# Patient Record
Sex: Female | Born: 1985 | Race: White | Hispanic: No | Marital: Married | Smoking: Never smoker
Health system: Southern US, Community
[De-identification: ages and names within clinical notes are randomized; demographics above are authoritative.]

## PROBLEM LIST (undated history)

## (undated) DIAGNOSIS — Z789 Other specified health status: Secondary | ICD-10-CM

## (undated) HISTORY — PX: NO PAST SURGERIES: SHX2092

---

## 2015-10-17 LAB — OB RESULTS CONSOLE RPR: RPR: NONREACTIVE

## 2015-10-17 LAB — OB RESULTS CONSOLE HEPATITIS B SURFACE ANTIGEN: HEP B S AG: NEGATIVE

## 2015-10-17 LAB — OB RESULTS CONSOLE HIV ANTIBODY (ROUTINE TESTING): HIV: NONREACTIVE

## 2015-10-17 LAB — OB RESULTS CONSOLE VARICELLA ZOSTER ANTIBODY, IGG: Varicella: IMMUNE

## 2015-10-17 LAB — OB RESULTS CONSOLE RUBELLA ANTIBODY, IGM: Rubella: IMMUNE

## 2015-10-18 ENCOUNTER — Other Ambulatory Visit: Payer: Self-pay | Admitting: Obstetrics and Gynecology

## 2015-10-18 DIAGNOSIS — Z369 Encounter for antenatal screening, unspecified: Secondary | ICD-10-CM

## 2015-11-05 ENCOUNTER — Ambulatory Visit (HOSPITAL_BASED_OUTPATIENT_CLINIC_OR_DEPARTMENT_OTHER)
Admission: RE | Admit: 2015-11-05 | Discharge: 2015-11-05 | Disposition: A | Discharge status: Home / Self Care | Source: Ambulatory Visit | Attending: Obstetrics and Gynecology | Admitting: Obstetrics and Gynecology

## 2015-11-05 ENCOUNTER — Ambulatory Visit
Admission: RE | Admit: 2015-11-05 | Discharge: 2015-11-05 | Disposition: A | Discharge status: Home / Self Care | Source: Ambulatory Visit | Attending: Obstetrics and Gynecology | Admitting: Obstetrics and Gynecology

## 2015-11-05 DIAGNOSIS — Z36 Encounter for antenatal screening of mother: Secondary | ICD-10-CM | POA: Diagnosis not present

## 2015-11-05 DIAGNOSIS — Z369 Encounter for antenatal screening, unspecified: Secondary | ICD-10-CM | POA: Insufficient documentation

## 2015-11-05 HISTORY — DX: Other specified health status: Z78.9

## 2015-11-05 NOTE — Progress Notes (Addendum)
[facility]  [facility] Length of Consultation: 40 minutes   Ms. Suzanne Bradley  [facility] for genetic counseling to review prenatal screening and testing options.  This note summarizes the information we discussed.    We offered the following routine screening tests for this pregnancy:  First trimester screening, which includes nuchal translucency ultrasound screen and first trimester maternal serum marker screening.  The nuchal translucency has approximately an 80% detection rate for Down syndrome and can be positive for other chromosome abnormalities as well as congenital heart defects.  When combined with a maternal serum marker screening, the detection rate is up to 90% for Down syndrome and up to 97% for trisomy 18.     Maternal serum marker screening, a blood test that measures pregnancy proteins, can provide risk assessments for Down syndrome, trisomy 18, and open neural tube defects (spina bifida, anencephaly). Because it does not directly examine the fetus, it cannot positively diagnose or rule out these problems.  Targeted ultrasound uses high frequency sound waves to create an image of the developing fetus.  An ultrasound is often recommended as a routine means of evaluating the pregnancy.  It is also used to screen for fetal anatomy problems (for example, a heart defect) that might be suggestive of a chromosomal or other abnormality.   Should these screening tests indicate an increased concern, then the following additional testing options would be offered:  The chorionic villus sampling procedure is available for first trimester chromosome analysis.  This involves the withdrawal of a small amount of chorionic villi (tissue from the developing placenta).  Risk of pregnancy loss is estimated to be approximately 1 in 200 to 1 in 100 (0.5 to 1%).  There is approximately a 1% (1 in 100) chance that the CVS chromosome results will be unclear.   Chorionic villi cannot be tested for neural tube defects.     Amniocentesis involves the removal of a small amount of amniotic fluid from the sac surrounding the fetus with the use of a thin needle inserted through the maternal abdomen and uterus.  Ultrasound guidance is used throughout the procedure.  Fetal cells from amniotic fluid are directly evaluated and > 99.5% of chromosome problems and > 98% of open neural tube defects can be detected. This procedure is generally performed after the 15th week of pregnancy.  The main risks to this procedure include complications leading to miscarriage in less than 1 in 200 cases (0.5%).  As another option for information if the pregnancy is suspected to be an an increased chance for certain chromosome conditions, we also reviewed the availability of cell free fetal DNA testing from maternal blood to determine whether or not the baby may have either Down syndrome, trisomy 5413, or trisomy 2118.  This test utilizes a maternal blood sample and DNA sequencing technology to isolate circulating cell free fetal DNA from maternal plasma.  The fetal DNA can then be analyzed for DNA sequences that are derived from the three most common chromosomes involved in aneuploidy, chromosomes 13, 18, and 21.  If the overall amount of DNA is greater than the expected level for any of these chromosomes, aneuploidy is suspected.  While we do not consider it a replacement for invasive testing and karyotype analysis, a negative result from this testing would be reassuring, though not a guarantee of a normal chromosome complement for the baby.  An abnormal result is certainly suggestive of an abnormal chromosome complement, though  we would still recommend CVS or amniocentesis to confirm any findings from this testing.  Cystic Fibrosis and Spinal Muscular Atrophy (SMA) screening were also discussed with the patient. Both conditions are recessive, which means that both parents must be carriers in  order to have a child with the disease.  Cystic fibrosis (CF) is one of the most common genetic conditions in persons of Caucasian ancestry.  This condition occurs in approximately 1 in 2,500 Caucasian persons and results in thickened secretions in the lungs, digestive, and reproductive systems.  For a baby to be at risk for having CF, both of the parents must be carriers for this condition.  Approximately 1 in 68 Caucasian persons is a carrier for CF.  Current carrier testing looks for the most common mutations in the gene for CF and can detect approximately 90% of carriers in the Caucasian population.  This means that the carrier screening can greatly reduce, but cannot eliminate, the chance for an individual to have a child with CF.  If an individual is found to be a carrier for CF, then carrier testing would be available for the partner. As part of [place]'s newborn screening profile, all babies born in the state of [place] will have a two-tier screening process.  Specimens are first tested to determine the concentration of immunoreactive trypsinogen (IRT).  The top 5% of specimens with the highest IRT values then undergo DNA testing using a panel of over 40 common CF mutations. SMA is a neurodegenerative disorder that leads to atrophy of skeletal muscle and overall weakness.  This condition is also more prevalent in the Caucasian population, with 1 in 40-1 in 60 persons being a carrier and 1 in 6,000-1 in 10,000 children being affected.  There are multiple forms of the disease, with some causing death in infancy to other forms with survival into adulthood.  The genetics of SMA is complex, but carrier screening can detect up to 95% of carriers in the Caucasian population.  Similar to CF, a negative result can greatly reduce, but cannot eliminate, the chance to have a child with SMA.  We obtained a detailed family history and pregnancy history.  This is the second pregnancy for this couple.  They  have a healthy 63 year old daughter.  The father of the baby, Joselyn Glassman, is on Humira for treatment of psoriasis.  Only two studies were found on the reproductive issues of this medication in men.  One showed no adverse effects on sperm parameters while the other found semen quality in 10 patients to be poorer than in controls.  No specific birth defects were mentioned in these studies.  Joselyn Glassman also mentioned that his father and one paternal uncle passed away in their 24s from a massive heart attack.  He stated that his mother took him to be testing for the same condition and he did not have it.  He was not aware of the name of the condition or what type of testing was done.  Some genetic conditions may predispose to early heart attack and/or sudden death and we offered to review medical records on this testing, which he declined.  We encouraged him to stay in contact with his primary care doctor regarding this history.  The remainder of the family history was reported to be unremarkable for birth defects, mental retardation, recurrent pregnancy loss or known chromosome abnormalities.  Ms. Biggers reported no complications or exposures in this pregnancy that would be expected to increase the risk  for birth defects.   After consideration of the options, Ms. Pederson elected to proceed with first trimester screening and to decline CF and SMA carrier testing.  An ultrasound was performed at the time of the visit.  The gestational age was consistent with  13 weeks.  Fetal anatomy could not be assessed due to early gestational age.  Please refer to the ultrasound report for details of that study.  Ms. Marion was encouraged to call with questions or concerns.  We can be contacted at [phone].    Cherly Anderson, MS, CGC  I saw the pt and agree with plan in genetic counselors note Jimmey Ralph, MD

## 2015-11-08 ENCOUNTER — Telehealth: Payer: Self-pay | Admitting: Obstetrics and Gynecology

## 2015-11-08 NOTE — Telephone Encounter (Signed)
Ms. Suzanne Bradley  elected to undergo First Trimester screening on 11/05/2015.  To review, first trimester screening, includes nuchal translucency ultrasound screen and/or first trimester maternal serum marker screening.  The nuchal translucency has approximately an 80% detection rate for Down syndrome and can be positive for other chromosome abnormalities as well as heart defects.  When combined with a maternal serum marker screening, the detection rate is up to 90% for Down syndrome and up to 97% for trisomy 13 and 18.     The results of the First Trimester Nuchal Translucency and Biochemical Screening were within normal range.  The risk for Down syndrome is now estimated to be 1 in 9,424.  The risk for Trisomy 13/18 is less than 1 in 10,000.  Should more definitive information be desired, we would offer amniocentesis.  Because we do not yet know the effectiveness of combined first and second trimester screening, we do not recommend a maternal serum screen to assess the chance for chromosome conditions.  However, if screening for neural tube defects is desired, maternal serum screening for AFP only can be performed between 15 and [redacted] weeks gestation.     Suzanne Andersoneborah F. Duvan Mousel, MS, CGC

## 2016-02-25 NOTE — L&D Delivery Note (Signed)
VAGINAL DELIVERY NOTE:  Date of Delivery: 05/09/2016 Primary OB: Valetta Mulroy/Ward Gestational Age/EDD: 4656w3d 05/06/2016, by Last Menstrual Period Antepartum complications: post-dates Attending Physician: Yetta BarreJones, CNM Delivery Type: spontaneous vaginal delivery  Anesthesia: none Laceration: none Episiotomy: none Placenta: spontaneous Intrapartum complications: None Estimated Blood Loss: 100ml GBS: Neg Procedure Details: CTSP due to C/C/+2 station. Arrived and pt placed in lithotomy pos. Pushing x 3 and NSVD of viable female in straight OA pos with no CAN. Vtx, Ant and post shoulder del at 1249 and to mom's abd, short cord noted, delayed cord clamping till pulsation stopped and then CCx2 and cut per dad. 3 VC noted. Baby crying. SDOP intact at 1255. EBL 100 mls. VSS. No lacs or epis. Bonding with infant. Skin to skin on mom's chest. Cord blood sent. Hemostasis achieved.   Baby: Liveborn Female , Apgars: 8,9 , weight  #, oz, baby named "

## 2016-04-11 LAB — OB RESULTS CONSOLE GC/CHLAMYDIA
Chlamydia: NEGATIVE
Gonorrhea: NEGATIVE

## 2016-04-11 LAB — OB RESULTS CONSOLE GBS: STREP GROUP B AG: NEGATIVE

## 2016-04-25 ENCOUNTER — Inpatient Hospital Stay
Admission: EM | Admit: 2016-04-25 | Discharge: 2016-04-25 | Disposition: A | Discharge status: Home / Self Care | Attending: Obstetrics and Gynecology | Admitting: Obstetrics and Gynecology

## 2016-04-25 DIAGNOSIS — Z3A38 38 weeks gestation of pregnancy: Secondary | ICD-10-CM | POA: Diagnosis not present

## 2016-04-25 DIAGNOSIS — O471 False labor at or after 37 completed weeks of gestation: Secondary | ICD-10-CM | POA: Diagnosis not present

## 2016-04-25 NOTE — Progress Notes (Signed)
TRIAGE VISIT with NST  Indication: Contractions and leaking of fluid   S: Suzanne Bradley is a 30 y.o. G2P1001. She is at [redacted]w[redacted]d gestation, presenting with signs of labor.  She called the office today with c/o leaking clear fluid this morning.  On exam in the office she was 3/80%/-3/vtx/posterior.  She states she has not worn a pad today, but that she kept feeling wet with a clear white discharge.  She denies abnormal vaginal discharge, odor, or dysuria.  She c/o irregular contractions.  She endorses good fetal movement.    O:  BP 117/75   Pulse 73   LMP 07/31/2015  No results found for this or any previous visit (from the past 48 hour(s)).   Gen: NAD, AAOx3                                           Abd: FNTTP                                                     Ext: Non-tender, Nonedmeatous                      NST/FHT: Baseline: 135 bpm/ moderate variability/ +accels/ no decels  TOCO: irregular  SVE:Dilation: 3 Effacement (%): 60 Cervical Position: Posterior Exam by:: M. Sigmon CNM  Some bloody show present on glove, but the patient had been checked 3 times and her cervix is very posterior.    NST: Reactive. See FHT above for particulars.  A/P:  30 y.o. G2P1001 [redacted]w[redacted]d with rule out rupture of membranes.    Labor: not present.   R/o ROM: Nitrazine negative x 2, no pooling noted.   Fetal Wellbeing: NST reactive Reassuring Cat 1 tracing.  FKC's daily   Labor precautions and warning s/s reviewed   D/c home stable, precautions reviewed, follow-up as scheduled.   F/U in office on 04/29/16   Meredith Sigmon, CNM  

## 2016-04-25 NOTE — Final Progress Note (Signed)
TRIAGE VISIT with NST  Indication: Contractions and leaking of fluid   S: Suzanne Bradley is a 31 y.o. G2P1001. She is at 3253w3d gestation, presenting with signs of labor.  She called the office today with c/o leaking clear fluid this morning.  On exam in the office she was 3/80%/-3/vtx/posterior.  She states she has not worn a pad today, but that she kept feeling wet with a clear white discharge.  She denies abnormal vaginal discharge, odor, or dysuria.  She c/o irregular contractions.  She endorses good fetal movement.    O:  BP 117/75   Pulse 73   LMP 07/31/2015  No results found for this or any previous visit (from the past 48 hour(s)).   Gen: NAD, AAOx3                                           Abd: FNTTP                                                     Ext: Non-tender, Nonedmeatous                      NST/FHT: Baseline: 135 bpm/ moderate variability/ +accels/ no decels  TOCO: irregular  ZOX:[id number]:Dilation: 3 Effacement (%): 60 Cervical Position: Posterior Exam by:: M. Cougar Imel CNM  Some bloody show present on glove, but the patient had been checked 3 times and her cervix is very posterior.    NST: Reactive. See FHT above for particulars.  A/P:  31 y.o. G2P1001 6353w3d with rule out rupture of membranes.    Labor: not present.   R/o ROM: Nitrazine negative x 2, no pooling noted.   Fetal Wellbeing: NST reactive Reassuring Cat 1 tracing.  FKC's daily   Labor precautions and warning s/s reviewed   D/c home stable, precautions reviewed, follow-up as scheduled.   F/U in office on 04/29/16   Carlean JewsMeredith Valera Vallas, CNM

## 2016-04-25 NOTE — OB Triage Note (Signed)
Patient presents to L&D with c/o leaking of clear fluid since 1100 this am.  Patient denies any contractions and states baby is moving well.  Patients any vaginal bleeding other than spotting after her cervix was checked on Tuesday.

## 2016-04-25 NOTE — Discharge Instructions (Signed)
Perform your kick counts twice a day Return for vaginal bleeding, leaking of fluid.

## 2016-05-05 ENCOUNTER — Other Ambulatory Visit: Payer: Self-pay | Admitting: Obstetrics and Gynecology

## 2016-05-05 ENCOUNTER — Encounter: Payer: Self-pay | Admitting: Obstetrics and Gynecology

## 2016-05-05 DIAGNOSIS — Z3490 Encounter for supervision of normal pregnancy, unspecified, unspecified trimester: Secondary | ICD-10-CM | POA: Insufficient documentation

## 2016-05-05 MED ORDER — BUTORPHANOL TARTRATE 1 MG/ML IJ SOLN: 1.0000 mg | INTRAMUSCULAR | Status: AC | PRN

## 2016-05-09 ENCOUNTER — Inpatient Hospital Stay: Admitting: Anesthesiology

## 2016-05-09 ENCOUNTER — Inpatient Hospital Stay
Admission: EM | Admit: 2016-05-09 | Discharge: 2016-05-10 | DRG: 775 | Disposition: A | Discharge status: Home / Self Care | Source: Ambulatory Visit | Attending: Obstetrics and Gynecology | Admitting: Obstetrics and Gynecology

## 2016-05-09 DIAGNOSIS — O480 Post-term pregnancy: Secondary | ICD-10-CM | POA: Diagnosis present

## 2016-05-09 DIAGNOSIS — Z3A40 40 weeks gestation of pregnancy: Secondary | ICD-10-CM | POA: Diagnosis not present

## 2016-05-09 DIAGNOSIS — Z369 Encounter for antenatal screening, unspecified: Secondary | ICD-10-CM

## 2016-05-09 DIAGNOSIS — Z3493 Encounter for supervision of normal pregnancy, unspecified, third trimester: Secondary | ICD-10-CM | POA: Diagnosis present

## 2016-05-09 LAB — CBC
HCT: 40.6 % (ref 35.0–47.0)
Hemoglobin: 14 g/dL (ref 12.0–16.0)
MCH: 30.4 pg (ref 26.0–34.0)
MCHC: 34.5 g/dL (ref 32.0–36.0)
MCV: 88.1 fL (ref 80.0–100.0)
PLATELETS: 199 10*3/uL (ref 150–440)
RBC: 4.61 MIL/uL (ref 3.80–5.20)
RDW: 14.1 % (ref 11.5–14.5)
WBC: 14.7 10*3/uL — ABNORMAL HIGH (ref 3.6–11.0)

## 2016-05-09 LAB — RAPID HIV SCREEN (HIV 1/2 AB+AG)
HIV 1/2 ANTIBODIES: NONREACTIVE
HIV-1 P24 Antigen - HIV24: NONREACTIVE

## 2016-05-09 LAB — TYPE AND SCREEN
ABO/RH(D): O POS
Antibody Screen: NEGATIVE

## 2016-05-09 MED ORDER — NALBUPHINE HCL 10 MG/ML IJ SOLN: 5.0000 mg | INTRAMUSCULAR | Status: DC | PRN

## 2016-05-09 MED ORDER — LIDOCAINE-EPINEPHRINE (PF) 1.5 %-1:200000 IJ SOLN
INTRAMUSCULAR | Status: DC | PRN
  Administered 2016-05-09: 3 mL via EPIDURAL

## 2016-05-09 MED ORDER — FLEET ENEMA 7-19 GM/118ML RE ENEM: 1.0000 | ENEMA | Freq: Every day | RECTAL | Status: DC | PRN

## 2016-05-09 MED ORDER — FENTANYL 2.5 MCG/ML W/ROPIVACAINE 0.2% IN NS 100 ML EPIDURAL INFUSION (ARMC-ANES)
10.0000 mL/h | EPIDURAL | Status: DC
  Administered 2016-05-09: 10 mL/h via EPIDURAL
  Filled 2016-05-09: qty 100

## 2016-05-09 MED ORDER — AMMONIA AROMATIC IN INHA
RESPIRATORY_TRACT | Status: AC
  Filled 2016-05-09: qty 10

## 2016-05-09 MED ORDER — DIPHENHYDRAMINE HCL 25 MG PO CAPS: 25.0000 mg | ORAL_CAPSULE | ORAL | Status: DC | PRN

## 2016-05-09 MED ORDER — DIPHENHYDRAMINE HCL 50 MG/ML IJ SOLN: 12.5000 mg | INTRAMUSCULAR | Status: DC | PRN

## 2016-05-09 MED ORDER — LIDOCAINE HCL (PF) 1 % IJ SOLN
INTRAMUSCULAR | Status: DC | PRN
  Administered 2016-05-09: 3 mL via SUBCUTANEOUS

## 2016-05-09 MED ORDER — SIMETHICONE 80 MG PO CHEW: 80.0000 mg | CHEWABLE_TABLET | ORAL | Status: DC | PRN

## 2016-05-09 MED ORDER — MEASLES, MUMPS & RUBELLA VAC ~~LOC~~ INJ
0.5000 mL | INJECTION | Freq: Once | SUBCUTANEOUS | Status: DC
  Filled 2016-05-09: qty 0.5

## 2016-05-09 MED ORDER — LIDOCAINE HCL (PF) 1 % IJ SOLN: 30.0000 mL | INTRAMUSCULAR | Status: DC | PRN

## 2016-05-09 MED ORDER — NALBUPHINE HCL 10 MG/ML IJ SOLN: 5.0000 mg | Freq: Once | INTRAMUSCULAR | Status: DC | PRN

## 2016-05-09 MED ORDER — SENNOSIDES-DOCUSATE SODIUM 8.6-50 MG PO TABS
2.0000 | ORAL_TABLET | ORAL | Status: DC
  Administered 2016-05-09: 2 via ORAL
  Filled 2016-05-09: qty 2

## 2016-05-09 MED ORDER — BUPIVACAINE HCL (PF) 0.25 % IJ SOLN
INTRAMUSCULAR | Status: DC | PRN
  Administered 2016-05-09 (×2): 5 mL via EPIDURAL

## 2016-05-09 MED ORDER — ONDANSETRON HCL 4 MG/2ML IJ SOLN: 4.0000 mg | INTRAMUSCULAR | Status: DC | PRN

## 2016-05-09 MED ORDER — BUTORPHANOL TARTRATE 1 MG/ML IJ SOLN: 1.0000 mg | INTRAMUSCULAR | Status: DC | PRN

## 2016-05-09 MED ORDER — FENTANYL 2.5 MCG/ML W/ROPIVACAINE 0.2% IN NS 100 ML EPIDURAL INFUSION (ARMC-ANES)
EPIDURAL | Status: AC
  Filled 2016-05-09: qty 100

## 2016-05-09 MED ORDER — ONDANSETRON HCL 4 MG PO TABS: 4.0000 mg | ORAL_TABLET | ORAL | Status: DC | PRN

## 2016-05-09 MED ORDER — SODIUM CHLORIDE 0.9% FLUSH: 3.0000 mL | INTRAVENOUS | Status: DC | PRN

## 2016-05-09 MED ORDER — LIDOCAINE HCL (PF) 1 % IJ SOLN
INTRAMUSCULAR | Status: AC
  Filled 2016-05-09: qty 30

## 2016-05-09 MED ORDER — MEPERIDINE HCL 25 MG/ML IJ SOLN: 6.2500 mg | INTRAMUSCULAR | Status: DC | PRN

## 2016-05-09 MED ORDER — TETANUS-DIPHTH-ACELL PERTUSSIS 5-2.5-18.5 LF-MCG/0.5 IM SUSP: 0.5000 mL | Freq: Once | INTRAMUSCULAR | Status: DC

## 2016-05-09 MED ORDER — ACETAMINOPHEN 325 MG PO TABS: 650.0000 mg | ORAL_TABLET | ORAL | Status: DC | PRN

## 2016-05-09 MED ORDER — SOD CITRATE-CITRIC ACID 500-334 MG/5ML PO SOLN
30.0000 mL | ORAL | Status: DC | PRN
  Filled 2016-05-09: qty 30

## 2016-05-09 MED ORDER — LACTATED RINGERS IV SOLN
500.0000 mL | INTRAVENOUS | Status: DC | PRN
  Administered 2016-05-09: 500 mL via INTRAVENOUS

## 2016-05-09 MED ORDER — DIBUCAINE 1 % RE OINT: 1.0000 "application " | TOPICAL_OINTMENT | RECTAL | Status: DC | PRN

## 2016-05-09 MED ORDER — PRENATAL MULTIVITAMIN CH
1.0000 | ORAL_TABLET | Freq: Every day | ORAL | Status: DC
  Administered 2016-05-10: 1 via ORAL
  Filled 2016-05-09: qty 1

## 2016-05-09 MED ORDER — OXYTOCIN BOLUS FROM INFUSION
500.0000 mL | Freq: Once | INTRAVENOUS | Status: AC
  Administered 2016-05-09: 500 mL via INTRAVENOUS

## 2016-05-09 MED ORDER — IBUPROFEN 600 MG PO TABS
600.0000 mg | ORAL_TABLET | Freq: Four times a day (QID) | ORAL | Status: DC
  Administered 2016-05-10 (×3): 600 mg via ORAL
  Filled 2016-05-09 (×3): qty 1

## 2016-05-09 MED ORDER — COCONUT OIL OIL
1.0000 "application " | TOPICAL_OIL | Status: DC | PRN
  Filled 2016-05-09: qty 120

## 2016-05-09 MED ORDER — SODIUM CHLORIDE 0.9% FLUSH: 3.0000 mL | Freq: Two times a day (BID) | INTRAVENOUS | Status: DC

## 2016-05-09 MED ORDER — OXYTOCIN 40 UNITS IN LACTATED RINGERS INFUSION - SIMPLE MED
2.5000 [IU]/h | INTRAVENOUS | Status: DC
  Administered 2016-05-09: 2.5 [IU]/h via INTRAVENOUS
  Filled 2016-05-09: qty 1000

## 2016-05-09 MED ORDER — MISOPROSTOL 200 MCG PO TABS
ORAL_TABLET | ORAL | Status: AC
  Filled 2016-05-09: qty 4

## 2016-05-09 MED ORDER — WITCH HAZEL-GLYCERIN EX PADS: 1.0000 "application " | MEDICATED_PAD | CUTANEOUS | Status: DC | PRN

## 2016-05-09 MED ORDER — LACTATED RINGERS IV SOLN
INTRAVENOUS | Status: DC
  Administered 2016-05-09: 09:00:00 via INTRAVENOUS

## 2016-05-09 MED ORDER — KETOROLAC TROMETHAMINE 30 MG/ML IJ SOLN: 30.0000 mg | Freq: Four times a day (QID) | INTRAMUSCULAR | Status: DC | PRN

## 2016-05-09 MED ORDER — NALOXONE HCL 2 MG/2ML IJ SOSY
1.0000 ug/kg/h | PREFILLED_SYRINGE | INTRAVENOUS | Status: DC | PRN
  Filled 2016-05-09: qty 2

## 2016-05-09 MED ORDER — NALOXONE HCL 0.4 MG/ML IJ SOLN: 0.4000 mg | INTRAMUSCULAR | Status: DC | PRN

## 2016-05-09 MED ORDER — SODIUM CHLORIDE 0.9 % IV SOLN: 250.0000 mL | INTRAVENOUS | Status: DC | PRN

## 2016-05-09 MED ORDER — BISACODYL 10 MG RE SUPP: 10.0000 mg | Freq: Every day | RECTAL | Status: DC | PRN

## 2016-05-09 MED ORDER — BENZOCAINE-MENTHOL 20-0.5 % EX AERO: 1.0000 "application " | INHALATION_SPRAY | CUTANEOUS | Status: DC | PRN

## 2016-05-09 MED ORDER — ONDANSETRON HCL 4 MG/2ML IJ SOLN: 4.0000 mg | Freq: Four times a day (QID) | INTRAMUSCULAR | Status: DC | PRN

## 2016-05-09 MED ORDER — ONDANSETRON HCL 4 MG/2ML IJ SOLN: 4.0000 mg | Freq: Three times a day (TID) | INTRAMUSCULAR | Status: DC | PRN

## 2016-05-09 MED ORDER — ZOLPIDEM TARTRATE 5 MG PO TABS
5.0000 mg | ORAL_TABLET | Freq: Every evening | ORAL | Status: DC | PRN
Start: 2016-05-09 — End: 2016-05-10

## 2016-05-09 MED ORDER — IBUPROFEN 600 MG PO TABS
600.0000 mg | ORAL_TABLET | Freq: Four times a day (QID) | ORAL | Status: DC
  Administered 2016-05-09: 600 mg via ORAL
  Filled 2016-05-09: qty 1

## 2016-05-09 MED ORDER — OXYTOCIN 10 UNIT/ML IJ SOLN
INTRAMUSCULAR | Status: AC
  Filled 2016-05-09: qty 2

## 2016-05-09 NOTE — Discharge Summary (Signed)
Obstetric Discharge Summary Reason for Admission: onset of labor Prenatal Procedures: ultrasound Intrapartum Procedures: spontaneous vaginal delivery with no lacs Postpartum Procedures: none Complications-Operative and Postpartum: none Hemoglobin  Date Value Ref Range Status  05/10/2016 11.7 (L) 12.0 - 16.0 g/dL Final   HCT  Date Value Ref Range Status  05/10/2016 34.7 (L) 35.0 - 47.0 % Final    Physical Exam:  General: alert, cooperative and appears stated age Lochia: appropriate Uterine Fundus: firm Incision:None DVT Evaluation:Neg Homans  Discharge Diagnoses:NSVD of viable female in NAD.  Discharge Information:  Date: 05/10/2016 Activity: NO sex or driving for 6 weeks Diet:Reg Medications: PNV, Fe Condition:Stable  Instructions[facility] Discharge to: Home Follow-up Information    Sharee Pimplearon W Jones, CNM Follow up in 6 week(s).   Specialty:  Obstetrics and Gynecology Contact information: [address] [facility] [place] [address] [phone]         Contraception at 6 weeks pp to be discussed  Newborn Data: Live born female  Birth Weight: 7 lb 15.7 oz (3620 g) APGAR: 8, 9   Sharee PimpleCaron W Jones 05/10/2016, 4:38 PM

## 2016-05-09 NOTE — Progress Notes (Signed)
Suzanne Bradley is a 31 y.o. G2P1001 at 652w3d by dating and is laboring normally.   Subjective: "Comfortable now'  Objective: BP 117/76   Pulse 79   Temp 97.6 F (36.4 C) (Oral)   Resp 18   Ht 5\' 4"  (1.626 m)   Wt 86.6 kg (191 lb)   LMP 07/31/2015   BMI 32.79 kg/m  No intake/output data recorded. No intake/output data recorded.  FHT: 150, reactive, Cat 1, 1 decel noted with recover, mod variability UC:   q 1.5-2 mins, mod SVE:   Dilation: 6 Effacement (%): 90 Station: -2 Exam by:: JCM  Labs: Lab Results  Component Value Date   WBC 14.7 (H) 05/09/2016   HGB 14.0 05/09/2016   HCT 40.6 05/09/2016   MCV 88.1 05/09/2016   PLT 199 05/09/2016    Assessment / Plan: A:1. IUP at term 2. GBS neg  Labor: progressing  Preeclampsia:none Fetal Wellbeing:  reassuring Pain Control:  Epidural  I/D:none Anticipated MOD: SVD  Sharee Pimplearon W Jones 05/09/2016, 11:02 AM

## 2016-05-09 NOTE — Anesthesia Preprocedure Evaluation (Addendum)
Anesthesia Evaluation  Patient identified by MRN, date of birth, ID band Patient awake    Reviewed: Allergy & Precautions, H&P , NPO status , Patient's Chart, lab work & pertinent test results, reviewed documented beta blocker date and time   History of Anesthesia Complications Negative for: history of anesthetic complications  Airway Mallampati: I  TM Distance: >3 FB Neck ROM: full    Dental no notable dental hx.    Pulmonary neg pulmonary ROS,           Cardiovascular Exercise Tolerance: Good negative cardio ROS       Neuro/Psych negative neurological ROS  negative psych ROS   GI/Hepatic Neg liver ROS, GERD  ,  Endo/Other  negative endocrine ROS  Renal/GU negative Renal ROS  negative genitourinary   Musculoskeletal   Abdominal   Peds  Hematology negative hematology ROS (+)   Anesthesia Other Findings Past Medical History: No date: Medical history non-contributory   Reproductive/Obstetrics (+) Pregnancy                            Anesthesia Physical Anesthesia Plan  ASA: II  Anesthesia Plan: Epidural   Post-op Pain Management:    Induction:   Airway Management Planned:   Additional Equipment:   Intra-op Plan:   Post-operative Plan:   Informed Consent: I have reviewed the patients History and Physical, chart, labs and discussed the procedure including the risks, benefits and alternatives for the proposed anesthesia with the patient or authorized representative who has indicated his/her understanding and acceptance.   Dental Advisory Given  Plan Discussed with: Anesthesiologist, CRNA and Surgeon  Anesthesia Plan Comments:         Anesthesia Quick Evaluation

## 2016-05-09 NOTE — Anesthesia Procedure Notes (Signed)
Epidural Patient location during procedure: OB Start time: 05/09/2016 9:15 AM End time: 05/09/2016 9:26 AM  Staffing Anesthesiologist: Lenard SimmerKARENZ, Evian Salguero Performed: anesthesiologist   Preanesthetic Checklist Completed: patient identified, site marked, surgical consent, pre-op evaluation, timeout performed, IV checked, risks and benefits discussed and monitors and equipment checked  Epidural Patient position: sitting Prep: ChloraPrep Patient monitoring: heart rate, continuous pulse ox and blood pressure Approach: midline Location: L3-L4 Injection technique: LOR saline  Needle:  Needle type: Tuohy  Needle gauge: 17 G Needle length: 9 cm and 9 Needle insertion depth: 5.5 cm Catheter type: closed end flexible Catheter size: 19 Gauge Catheter at skin depth: 10 cm Test dose: negative and 1.5% lidocaine with Epi 1:200 K  Assessment Sensory level: T10 Events: blood not aspirated, injection not painful, no injection resistance, negative IV test and no paresthesia  Additional Notes Pt. Evaluated and documentation done after procedure finished. Patient identified. Risks/Benefits/Options discussed with patient including but not limited to bleeding, infection, nerve damage, paralysis, failed block, incomplete pain control, headache, blood pressure changes, nausea, vomiting, reactions to medication both or allergic, itching and postpartum back pain. Confirmed with bedside nurse the patient's most recent platelet count. Confirmed with patient that they are not currently taking any anticoagulation, have any bleeding history or any family history of bleeding disorders. Patient expressed understanding and wished to proceed. All questions were answered. Sterile technique was used throughout the entire procedure. Please see nursing notes for vital signs. Test dose was given through epidural catheter and negative prior to continuing to dose epidural or start infusion. Warning signs of high block given to  the patient including shortness of breath, tingling/numbness in hands, complete motor block, or any concerning symptoms with instructions to call for help. Patient was given instructions on fall risk and not to get out of bed. All questions and concerns addressed with instructions to call with any issues or inadequate analgesia.   Patient tolerated the insertion well without immediate complications.Reason for block:procedure for pain

## 2016-05-09 NOTE — H&P (Signed)
Suzanne Bradley is a 31 y.o. female presenting for labor pattern and formerly was scheduled for IOL but, is laboring now on own since yest. PNC at  [facility] significant for LMP of 07/31/15 & EDD of 05/06/16. NO ROM, NO VB or decreased FM.  OB History    Gravida Para Term Preterm AB Living   2 1 1     1    SAB TAB Ectopic Multiple Live Births           1     Past Medical History:  Diagnosis Date  . Medical history non-contributory    Past Surgical History:  Procedure Laterality Date  . NO PAST SURGERIES     Family History: family history is not on file. Social History:  reports that she has never smoked. She has never used smokeless tobacco. She reports that she does not drink alcohol or use drugs.     Maternal Diabetes: 127 Genetic Screening: NT WNL Maternal Ultrasounds/Referrals:Normal Anatomy  Fetal Ultrasounds or other Referrals:  None Maternal Substance Abuse:  No hx Significant Maternal Medications:PNV Significant Maternal Lab Results: see results Other Comments: none  Review of Systems  Constitutional: Negative.   HENT: Negative.   Eyes: Negative.   Respiratory: Negative.   Cardiovascular: Negative.   Gastrointestinal: Negative.   Genitourinary: Negative.   Musculoskeletal: Negative.   Skin: Negative.   Neurological: Negative.   Endo/Heme/Allergies: Negative.   Psychiatric/Behavioral: Negative.   GYN:+pain with UC's History Dilation: 5 Effacement (%): 90 Station: -3 Exam by:: JCM Blood pressure 127/89, pulse 96, temperature 97.6 F (36.4 C), temperature source Oral, last menstrual period 07/31/2015. Exam Physical Exam  Gen: 31 yo white female in NAD. A,A&O x3 Heart: S1S2, RRR, No M/R?G. Lungs: CTA bilat, no W/R/R. Abd: Mod UC's palp, Gravid Cx: 5/cms per RN Prenatal labs: ABO, Rh:   O pos Antibody:  Neg Rubella:  Immune RPR:   NR HBsAg:   Neg HIV:   Neg GBS:   Neg  1 h GCT 127 Assessment/Plan: 1. IUP at 40 3/7 weeks 2. GBS neg P: Admit for  delivery 2. Plans epidural 3. AROM when comfortable.   Sharee PimpleCaron W Ryon Layton 05/09/2016, 8:31 AM

## 2016-05-10 LAB — CBC
HEMATOCRIT: 34.7 % — AB (ref 35.0–47.0)
HEMOGLOBIN: 11.7 g/dL — AB (ref 12.0–16.0)
MCH: 30.4 pg (ref 26.0–34.0)
MCHC: 33.9 g/dL (ref 32.0–36.0)
MCV: 89.7 fL (ref 80.0–100.0)
Platelets: 147 10*3/uL — ABNORMAL LOW (ref 150–440)
RBC: 3.86 MIL/uL (ref 3.80–5.20)
RDW: 14.3 % (ref 11.5–14.5)
WBC: 15.4 10*3/uL — AB (ref 3.6–11.0)

## 2016-05-10 LAB — RPR: RPR Ser Ql: NONREACTIVE

## 2016-05-10 NOTE — Discharge Instructions (Signed)
Follow up sooner with fever, problems breathing, pain not helped by medications, severe depression( more than just baby blues, wanting to hurt yourself or the baby), severe bleeding ( saturating more than one pad an hour or large palm sized clots), no heavy lifting , no driving while taking narcotics, no douches, intercourse, tampons or enemas for 6 weeks Care After Vaginal Delivery Congratulations on your new baby!!  Refer to this sheet in the next few weeks. These discharge instructions provide you with information on caring for yourself after delivery. Your caregiver may also give you specific instructions. Your treatment has been planned according to the most current medical practices available, but problems sometimes occur. Call your caregiver if you have any problems or questions after you go home.  HOME CARE INSTRUCTIONS  Take over-the-counter or prescription medicines only as directed by your caregiver or pharmacist.  Do not drink alcohol, especially if you are breastfeeding or taking medicine to relieve pain.  Do not chew or smoke tobacco.  Do not use illegal drugs.  Continue to use good perineal care. Good perineal care includes:  Wiping your perineum from front to back.  Keeping your perineum clean.  Do not use tampons or douche until your caregiver says it is okay.  Shower, wash your hair, and take tub baths as directed by your caregiver.  Wear a well-fitting bra that provides breast support.  Eat healthy foods.  Drink enough fluids to keep your urine clear or pale yellow.  Eat high-fiber foods such as whole grain cereals and breads, brown rice, beans, and fresh fruits and vegetables every day. These foods may help prevent or relieve constipation.  Follow your caregiver's recommendations regarding resumption of activities such as climbing stairs, driving, lifting, exercising, or traveling. Specifically, no driving for two weeks, so that you are comfortable reacting  quickly in an emergency.  Talk to your caregiver about resuming sexual activities. Resumption of sexual activities is dependent upon your risk of infection, your rate of healing, and your comfort and desire to resume sexual activity. Usually we recommend waiting about six weeks, or until your bleeding stops and you are interested in sex.  Try to have someone help you with your household activities and your newborn for at least a few days after you leave the hospital. Even longer is better.  Rest as much as possible. Try to rest or take a nap when your newborn is sleeping. Sleep deprivation can be very hard after delivery.  Increase your activities gradually.  Keep all of your scheduled postpartum appointments. It is very important to keep your scheduled follow-up appointments. At these appointments, your caregiver will be checking to make sure that you are healing physically and emotionally.  SEEK MEDICAL CARE IF:   You are passing large clots from your vagina.   You have a foul smelling discharge from your vagina.  You have trouble urinating.  You are urinating frequently.  You have pain when you urinate.  You have a change in your bowel movements.  You have increasing redness, pain, or swelling near your vaginal incision (episiotomy) or vaginal tear.  You have pus draining from your episiotomy or vaginal tear.  Your episiotomy or vaginal tear is separating.  You have painful, hard, or reddened breasts.  You have a severe headache.  You have blurred vision or see spots.  You feel sad or depressed.  You have thoughts of hurting yourself or your newborn.  You have questions about your care, the care of  your newborn, or medicines.  You are dizzy or light-headed.  You have a rash.  You have nausea or vomiting.  You were breastfeeding and have not had a menstrual period within 12 weeks after you stopped breastfeeding.  You are not breastfeeding and have not had a  menstrual period by the 12th week after delivery.  You have a fever.  SEEK IMMEDIATE MEDICAL CARE IF:   You have persistent pain.  You have chest pain.  You have shortness of breath.  You faint.  You have leg pain.  You have stomach pain.  Your vaginal bleeding saturates two or more sanitary pads in 1 hour.  MAKE SURE YOU:   Understand these instructions.  Will get help right away if you are not doing well or get worse.   Document Released: 02/08/2000 Document Revised: 06/27/2013 Document Reviewed: 10/08/2011  [facility] Patient Information 2015 [place], [place]. This information is not intended to replace advice given to you by your health care provider. Make sure you discuss any questions you have with your health care provider.

## 2016-05-10 NOTE — Progress Notes (Signed)
All discharge instructions given to patient and she voices understanding of all instructions given. She will make her own f/u appt.  No prescriptions given by md. Patient discharged home with infant in arms escorted out in wheelchair.

## 2016-05-10 NOTE — Lactation Note (Signed)
This note was copied from a baby's chart. Lactation Consultation Note  Patient Name: Suzanne Bradley Date: 05/10/2016 Reason for consult: Follow-up assessment Mom c/o "lots of frequent, brief breastfeedings with sleepy baby". Baby had all clothes and wamr blanket on, shallow latch. Mom agreed to try skin to skin and different hold (football). I showed her how to get deep latch (folded bra underneath would have prevented good latch. She said she had been using that bra like that most of the time). I helped her listen for swallows and when comfortable sucking at breast turned into sharp pain on nipple, I showed her how to flange lips for better seal which decreased pain and increased milk transfer. When baby got sleepy, I showed her how breast compression and some stim to baby helped her nurse more vigorously with more swallows than without those efforts. It seems baby has had many sub par feedings, so it may take some good feeds in a row to settle her down to play "catch up" from all the poor feeds she may have had. She has been nursing well enough for baby to have proper diapers, etc, but could do better to be more satisfied and avoid trauma to mom. She has LC and BF support group contact info.   Maternal Data    Feeding Feeding Type: Breast Fed Length of feed: 20 min  LATCH Score/Interventions Latch: Grasps breast easily, tongue down, lips flanged, rhythmical sucking.  Audible Swallowing: A few with stimulation Intervention(s): Hand expression  Type of Nipple: Everted at rest and after stimulation  Comfort (Breast/Nipple): Filling, red/small blisters or bruises, mild/mod discomfort  Problem noted: Cracked, bleeding, blisters, bruises Interventions  (Cracked/bleeding/bruising/blister): Expressed breast milk to nipple (corrected position/latch; coconut oil)  Hold (Positioning): Assistance needed to correctly position infant at breast and maintain latch. Intervention(s): Support  Pillows  LATCH Score: 7  Lactation Tools Discussed/Used     Consult Status Consult Status: Complete    Suzanne Bradley 05/10/2016, 4:03 PM

## 2016-05-10 NOTE — Anesthesia Postprocedure Evaluation (Signed)
Anesthesia Post Note  Patient: Suzanne Bradley  Procedure(s) Performed: * No procedures listed *  Patient location during evaluation: Mother Baby Anesthesia Type: Epidural Level of consciousness: awake and alert Pain management: pain level controlled Vital Signs Assessment: post-procedure vital signs reviewed and stable Respiratory status: spontaneous breathing, nonlabored ventilation and respiratory function stable Cardiovascular status: stable Postop Assessment: no headache and no backache Anesthetic complications: no     Last Vitals:  Vitals:   05/10/16 0742 05/10/16 1500  BP: 118/78 122/81  Pulse: 71 66  Resp: 18 18  Temp: 36.5 C 36.8 C    Last Pain:  Vitals:   05/10/16 1500  TempSrc: Oral  PainSc:                  Lenard SimmerAndrew Lacrecia Delval

## 2016-05-10 NOTE — Progress Notes (Addendum)
Post Partum Day 1 Subjective: I want to go home today  Objective: Blood pressure 118/78, pulse 71, temperature 97.7 F (36.5 C), temperature source Oral, resp. rate 18, height 5\' 4"  (1.626 m), weight 86.6 kg (191 lb), last menstrual period 07/31/2015, SpO2 99 %.  Physical Exam:  General: A,A&O x3 Heart:S1S2,RRR, No M/R/G Lungs: CTA bilat, no W/R/R. Lochia:Mod Uterine Fundus:FF and lochia mod Incision:NOne  DVT Evaluation: Neg Homans   Recent Labs  05/09/16 0828 05/10/16 0703  HGB 14.0 11.7*  HCT 40.6 34.7*    Assessment/Plan: A: 1. PPD#1 2. Lactation P: DC home today  LOS: 1 day   Sharee PimpleCaron W Melda Mermelstein 05/10/2016, 10:13 AM

## 2016-09-09 ENCOUNTER — Encounter (HOSPITAL_COMMUNITY): Payer: Self-pay
